# Patient Record
Sex: Male | Born: 1990 | Race: Black or African American | Hispanic: No | Marital: Single | Smoking: Current every day smoker
Health system: Southern US, Community
[De-identification: ages and names within clinical notes are randomized; demographics above are authoritative.]

---

## 2016-02-25 ENCOUNTER — Emergency Department (HOSPITAL_COMMUNITY): Payer: No Typology Code available for payment source

## 2016-02-25 ENCOUNTER — Encounter (HOSPITAL_COMMUNITY): Payer: Self-pay | Admitting: Anesthesiology

## 2016-02-25 ENCOUNTER — Inpatient Hospital Stay (HOSPITAL_COMMUNITY): Payer: No Typology Code available for payment source

## 2016-02-25 ENCOUNTER — Encounter (HOSPITAL_COMMUNITY): Payer: Self-pay

## 2016-02-25 ENCOUNTER — Inpatient Hospital Stay (HOSPITAL_COMMUNITY)
Admission: EM | Admit: 2016-02-25 | Discharge: 2016-02-27 | DRG: 090 | Disposition: A | Discharge status: Home / Self Care | Payer: No Typology Code available for payment source | Attending: General Surgery | Admitting: General Surgery

## 2016-02-25 DIAGNOSIS — L602 Onychogryphosis: Secondary | ICD-10-CM

## 2016-02-25 DIAGNOSIS — S060X9A Concussion with loss of consciousness of unspecified duration, initial encounter: Principal | ICD-10-CM | POA: Diagnosis present

## 2016-02-25 DIAGNOSIS — S01312A Laceration without foreign body of left ear, initial encounter: Secondary | ICD-10-CM | POA: Diagnosis present

## 2016-02-25 DIAGNOSIS — S51011A Laceration without foreign body of right elbow, initial encounter: Secondary | ICD-10-CM | POA: Diagnosis present

## 2016-02-25 DIAGNOSIS — M542 Cervicalgia: Secondary | ICD-10-CM | POA: Diagnosis present

## 2016-02-25 DIAGNOSIS — Y903 Blood alcohol level of 60-79 mg/100 ml: Secondary | ICD-10-CM | POA: Diagnosis present

## 2016-02-25 DIAGNOSIS — R402212 Coma scale, best verbal response, none, at arrival to emergency department: Secondary | ICD-10-CM | POA: Diagnosis present

## 2016-02-25 DIAGNOSIS — S060XAA Concussion with loss of consciousness status unknown, initial encounter: Secondary | ICD-10-CM | POA: Diagnosis present

## 2016-02-25 DIAGNOSIS — Z6835 Body mass index (BMI) 35.0-35.9, adult: Secondary | ICD-10-CM | POA: Diagnosis not present

## 2016-02-25 DIAGNOSIS — R402112 Coma scale, eyes open, never, at arrival to emergency department: Secondary | ICD-10-CM | POA: Diagnosis present

## 2016-02-25 DIAGNOSIS — R402312 Coma scale, best motor response, none, at arrival to emergency department: Secondary | ICD-10-CM | POA: Diagnosis present

## 2016-02-25 DIAGNOSIS — S161XXA Strain of muscle, fascia and tendon at neck level, initial encounter: Secondary | ICD-10-CM | POA: Diagnosis present

## 2016-02-25 DIAGNOSIS — F10129 Alcohol abuse with intoxication, unspecified: Secondary | ICD-10-CM | POA: Diagnosis present

## 2016-02-25 DIAGNOSIS — S0990XA Unspecified injury of head, initial encounter: Secondary | ICD-10-CM | POA: Diagnosis present

## 2016-02-25 DIAGNOSIS — E669 Obesity, unspecified: Secondary | ICD-10-CM | POA: Diagnosis present

## 2016-02-25 DIAGNOSIS — R000 Tachycardia, unspecified: Secondary | ICD-10-CM | POA: Diagnosis present

## 2016-02-25 LAB — I-STAT CHEM 8, ED
BUN: 12 mg/dL (ref 6–20)
CHLORIDE: 104 mmol/L (ref 101–111)
CREATININE: 1 mg/dL (ref 0.61–1.24)
Calcium, Ion: 1.05 mmol/L — ABNORMAL LOW (ref 1.13–1.30)
Glucose, Bld: 133 mg/dL — ABNORMAL HIGH (ref 65–99)
HEMATOCRIT: 48 % (ref 39.0–52.0)
Hemoglobin: 16.3 g/dL (ref 13.0–17.0)
Potassium: 3.2 mmol/L — ABNORMAL LOW (ref 3.5–5.1)
Sodium: 142 mmol/L (ref 135–145)
TCO2: 22 mmol/L (ref 0–100)

## 2016-02-25 LAB — URINALYSIS, ROUTINE W REFLEX MICROSCOPIC
Bilirubin Urine: NEGATIVE
Glucose, UA: NEGATIVE mg/dL
KETONES UR: NEGATIVE mg/dL
LEUKOCYTES UA: NEGATIVE
NITRITE: NEGATIVE
PROTEIN: NEGATIVE mg/dL
Specific Gravity, Urine: 1.029 (ref 1.005–1.030)
pH: 5.5 (ref 5.0–8.0)

## 2016-02-25 LAB — I-STAT ARTERIAL BLOOD GAS, ED
ACID-BASE DEFICIT: 1 mmol/L (ref 0.0–2.0)
BICARBONATE: 25.5 meq/L — AB (ref 20.0–24.0)
O2 SAT: 100 %
PCO2 ART: 49.3 mmHg — AB (ref 35.0–45.0)
PO2 ART: 451 mmHg — AB (ref 80.0–100.0)
TCO2: 27 mmol/L (ref 0–100)
pH, Arterial: 7.322 — ABNORMAL LOW (ref 7.350–7.450)

## 2016-02-25 LAB — PROTIME-INR
INR: 1.16 (ref 0.00–1.49)
PROTHROMBIN TIME: 15 s (ref 11.6–15.2)

## 2016-02-25 LAB — URINE MICROSCOPIC-ADD ON: WBC, UA: NONE SEEN WBC/hpf (ref 0–5)

## 2016-02-25 LAB — PREPARE FRESH FROZEN PLASMA
UNIT DIVISION: 0
Unit division: 0

## 2016-02-25 LAB — COMPREHENSIVE METABOLIC PANEL
ALBUMIN: 4.4 g/dL (ref 3.5–5.0)
ALK PHOS: 57 U/L (ref 38–126)
ALT: 50 U/L (ref 17–63)
AST: 46 U/L — AB (ref 15–41)
Anion gap: 12 (ref 5–15)
BILIRUBIN TOTAL: 0.8 mg/dL (ref 0.3–1.2)
BUN: 10 mg/dL (ref 6–20)
CALCIUM: 8.8 mg/dL — AB (ref 8.9–10.3)
CO2: 22 mmol/L (ref 22–32)
CREATININE: 1.07 mg/dL (ref 0.61–1.24)
Chloride: 105 mmol/L (ref 101–111)
GLUCOSE: 136 mg/dL — AB (ref 65–99)
POTASSIUM: 3.2 mmol/L — AB (ref 3.5–5.1)
Sodium: 139 mmol/L (ref 135–145)
TOTAL PROTEIN: 7.2 g/dL (ref 6.5–8.1)

## 2016-02-25 LAB — I-STAT CG4 LACTIC ACID, ED: Lactic Acid, Venous: 5.92 mmol/L (ref 0.5–1.9)

## 2016-02-25 LAB — TYPE AND SCREEN
ABO/RH(D): A POS
Antibody Screen: NEGATIVE
UNIT DIVISION: 0
UNIT DIVISION: 0

## 2016-02-25 LAB — BASIC METABOLIC PANEL
ANION GAP: 10 (ref 5–15)
BUN: 10 mg/dL (ref 6–20)
CALCIUM: 8.5 mg/dL — AB (ref 8.9–10.3)
CO2: 21 mmol/L — AB (ref 22–32)
CREATININE: 0.86 mg/dL (ref 0.61–1.24)
Chloride: 107 mmol/L (ref 101–111)
GFR calc non Af Amer: >60 mL/min (ref >60)
Glucose, Bld: 101 mg/dL — ABNORMAL HIGH (ref 65–99)
Potassium: 4 mmol/L (ref 3.5–5.1)
SODIUM: 138 mmol/L (ref 135–145)

## 2016-02-25 LAB — CBC
HCT: 43.1 % (ref 39.0–52.0)
HEMATOCRIT: 39.4 % (ref 39.0–52.0)
HEMOGLOBIN: 14.5 g/dL (ref 13.0–17.0)
HEMOGLOBIN: 13.3 g/dL (ref 13.0–17.0)
MCH: 28.4 pg (ref 26.0–34.0)
MCH: 28.5 pg (ref 26.0–34.0)
MCHC: 33.6 g/dL (ref 30.0–36.0)
MCHC: 33.8 g/dL (ref 30.0–36.0)
MCV: 84.5 fL (ref 78.0–100.0)
MCV: 84.5 fL (ref 78.0–100.0)
PLATELETS: 316 10*3/uL (ref 150–400)
Platelets: 274 10*3/uL (ref 150–400)
RBC: 5.1 MIL/uL (ref 4.22–5.81)
RBC: 4.66 MIL/uL (ref 4.22–5.81)
RDW: 14.1 % (ref 11.5–15.5)
RDW: 14.2 % (ref 11.5–15.5)
WBC: 14.3 10*3/uL — AB (ref 4.0–10.5)
WBC: 11.5 10*3/uL — AB (ref 4.0–10.5)

## 2016-02-25 LAB — ABO/RH: ABO/RH(D): A POS

## 2016-02-25 LAB — CDS SEROLOGY

## 2016-02-25 LAB — RAPID URINE DRUG SCREEN, HOSP PERFORMED
Amphetamines: NOT DETECTED
BENZODIAZEPINES: POSITIVE — AB
Barbiturates: NOT DETECTED
COCAINE: NOT DETECTED
OPIATES: NOT DETECTED
Tetrahydrocannabinol: POSITIVE — AB

## 2016-02-25 LAB — LACTIC ACID, PLASMA: LACTIC ACID, VENOUS: 3.4 mmol/L — AB (ref 0.5–1.9)

## 2016-02-25 LAB — ETHANOL: ALCOHOL ETHYL (B): 77 mg/dL — AB (ref <5)

## 2016-02-25 LAB — TRIGLYCERIDES: TRIGLYCERIDES: 161 mg/dL — AB (ref <150)

## 2016-02-25 LAB — MRSA PCR SCREENING: MRSA BY PCR: NEGATIVE

## 2016-02-25 MED ORDER — MIDAZOLAM HCL 2 MG/2ML IJ SOLN: 2.0000 mg | INTRAMUSCULAR | Status: DC | PRN

## 2016-02-25 MED ORDER — ETOMIDATE 2 MG/ML IV SOLN
INTRAVENOUS | Status: AC | PRN
  Administered 2016-02-25: 20 mg via INTRAVENOUS

## 2016-02-25 MED ORDER — PNEUMOCOCCAL VAC POLYVALENT 25 MCG/0.5ML IJ INJ
0.5000 mL | INJECTION | INTRAMUSCULAR | Status: DC
  Filled 2016-02-25: qty 0.5

## 2016-02-25 MED ORDER — MIDAZOLAM HCL 2 MG/2ML IJ SOLN
INTRAMUSCULAR | Status: AC
  Filled 2016-02-25: qty 4

## 2016-02-25 MED ORDER — STERILE WATER FOR INJECTION IJ SOLN
INTRAMUSCULAR | Status: AC
  Filled 2016-02-25: qty 20

## 2016-02-25 MED ORDER — SODIUM CHLORIDE 0.9 % IV SOLN
25.0000 ug/h | INTRAVENOUS | Status: DC
  Administered 2016-02-25: 50 ug/h via INTRAVENOUS
  Filled 2016-02-25 (×2): qty 50

## 2016-02-25 MED ORDER — ACETAMINOPHEN 325 MG PO TABS
650.0000 mg | ORAL_TABLET | ORAL | Status: DC | PRN
  Administered 2016-02-26: 650 mg via ORAL
  Filled 2016-02-25: qty 2

## 2016-02-25 MED ORDER — ENOXAPARIN SODIUM 30 MG/0.3ML ~~LOC~~ SOLN
30.0000 mg | Freq: Two times a day (BID) | SUBCUTANEOUS | Status: DC
  Administered 2016-02-25 – 2016-02-26 (×3): 30 mg via SUBCUTANEOUS
  Filled 2016-02-25 (×3): qty 0.3

## 2016-02-25 MED ORDER — PROPOFOL 1000 MG/100ML IV EMUL
INTRAVENOUS | Status: AC | PRN
  Administered 2016-02-25: 40.096 ug/kg/min via INTRAVENOUS

## 2016-02-25 MED ORDER — MIDAZOLAM HCL 2 MG/2ML IJ SOLN
INTRAMUSCULAR | Status: AC
  Filled 2016-02-25: qty 2

## 2016-02-25 MED ORDER — BISACODYL 10 MG RE SUPP: 10.0000 mg | Freq: Every day | RECTAL | Status: DC | PRN

## 2016-02-25 MED ORDER — FENTANYL CITRATE (PF) 100 MCG/2ML IJ SOLN
INTRAMUSCULAR | Status: AC
  Filled 2016-02-25: qty 4

## 2016-02-25 MED ORDER — IOPAMIDOL (ISOVUE-300) INJECTION 61%
INTRAVENOUS | Status: AC
  Administered 2016-02-25: 100 mL
  Filled 2016-02-25: qty 100

## 2016-02-25 MED ORDER — ONDANSETRON HCL 4 MG/2ML IJ SOLN
4.0000 mg | Freq: Four times a day (QID) | INTRAMUSCULAR | Status: DC | PRN
Start: 2016-02-25 — End: 2016-02-27

## 2016-02-25 MED ORDER — VECURONIUM BROMIDE 10 MG IV SOLR
INTRAVENOUS | Status: AC
  Filled 2016-02-25: qty 20

## 2016-02-25 MED ORDER — ONDANSETRON HCL 4 MG PO TABS: 4.0000 mg | ORAL_TABLET | Freq: Four times a day (QID) | ORAL | Status: DC | PRN

## 2016-02-25 MED ORDER — SUCCINYLCHOLINE CHLORIDE 20 MG/ML IJ SOLN
INTRAMUSCULAR | Status: AC | PRN
  Administered 2016-02-25: 100 mg via INTRAVENOUS

## 2016-02-25 MED ORDER — MIDAZOLAM HCL 5 MG/5ML IJ SOLN
INTRAMUSCULAR | Status: AC | PRN
  Administered 2016-02-25: 2 mg via INTRAVENOUS
  Administered 2016-02-25: 4 mg via INTRAVENOUS
  Administered 2016-02-25: 2 mg via INTRAVENOUS

## 2016-02-25 MED ORDER — FENTANYL CITRATE (PF) 100 MCG/2ML IJ SOLN: 50.0000 ug | Freq: Once | INTRAMUSCULAR | Status: AC

## 2016-02-25 MED ORDER — FENTANYL CITRATE (PF) 100 MCG/2ML IJ SOLN
INTRAMUSCULAR | Status: AC
  Filled 2016-02-25: qty 2

## 2016-02-25 MED ORDER — PROPOFOL 1000 MG/100ML IV EMUL
INTRAVENOUS | Status: AC
  Filled 2016-02-25: qty 100

## 2016-02-25 MED ORDER — ANTISEPTIC ORAL RINSE SOLUTION (CORINZ)
7.0000 mL | Freq: Four times a day (QID) | OROMUCOSAL | Status: DC
  Administered 2016-02-25 – 2016-02-26 (×6): 7 mL via OROMUCOSAL

## 2016-02-25 MED ORDER — VECURONIUM BROMIDE 10 MG IV SOLR
INTRAVENOUS | Status: AC | PRN
  Administered 2016-02-25: 10 mg via INTRAVENOUS

## 2016-02-25 MED ORDER — SODIUM CHLORIDE 0.9 % IV SOLN
INTRAVENOUS | Status: AC | PRN
  Administered 2016-02-25: 1000 mL via INTRAVENOUS

## 2016-02-25 MED ORDER — PROPOFOL 1000 MG/100ML IV EMUL
0.0000 ug/kg/min | INTRAVENOUS | Status: DC
  Administered 2016-02-25: 45 ug/kg/min via INTRAVENOUS
  Administered 2016-02-25: 50 ug/kg/min via INTRAVENOUS
  Administered 2016-02-25: 45 ug/kg/min via INTRAVENOUS
  Administered 2016-02-25 (×2): 50 ug/kg/min via INTRAVENOUS
  Administered 2016-02-25: 45 ug/kg/min via INTRAVENOUS
  Administered 2016-02-26 (×3): 50 ug/kg/min via INTRAVENOUS
  Filled 2016-02-25 (×9): qty 100

## 2016-02-25 MED ORDER — FENTANYL BOLUS VIA INFUSION
50.0000 ug | INTRAVENOUS | Status: DC | PRN
  Filled 2016-02-25: qty 50

## 2016-02-25 MED ORDER — SODIUM CHLORIDE 0.9 % IV SOLN
INTRAVENOUS | Status: DC
  Administered 2016-02-25 – 2016-02-26 (×3): via INTRAVENOUS

## 2016-02-25 MED ORDER — FENTANYL CITRATE (PF) 100 MCG/2ML IJ SOLN
INTRAMUSCULAR | Status: AC | PRN
  Administered 2016-02-25: 100 ug via INTRAVENOUS
  Administered 2016-02-25: 50 ug via INTRAVENOUS

## 2016-02-25 MED ORDER — CHLORHEXIDINE GLUCONATE 0.12% ORAL RINSE (MEDLINE KIT)
15.0000 mL | Freq: Two times a day (BID) | OROMUCOSAL | Status: DC
  Administered 2016-02-25 – 2016-02-26 (×3): 15 mL via OROMUCOSAL

## 2016-02-25 MED ORDER — HALOPERIDOL LACTATE 5 MG/ML IJ SOLN
5.0000 mg | Freq: Once | INTRAMUSCULAR | Status: AC
  Administered 2016-02-25: 5 mg via INTRAMUSCULAR
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
## 2016-02-25 NOTE — ED Notes (Signed)
Family updated as to patient's status. RN spoke [place]  [place]; Mother states it is ok for Jose NettersLeatrice Guzman to check on patient until she arrives;
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
## 2016-02-25 NOTE — Progress Notes (Signed)
CRITICAL VALUE ALERT  Critical value received:  3.4 Lactic Acid  Date of notification:  02/25/16  Time of notification:  0858  Critical value read back:Yes.    Nurse who received alert:  Serena ColonelYeni Pineda, RN  MD notified (1st page):    Time of first page:    MD notified (2nd page):  Time of second page:  Responding MD:    Time MD responded:
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
## 2016-02-26 ENCOUNTER — Encounter (HOSPITAL_COMMUNITY): Payer: Self-pay | Admitting: *Deleted

## 2016-02-26 ENCOUNTER — Inpatient Hospital Stay (HOSPITAL_COMMUNITY): Payer: No Typology Code available for payment source

## 2016-02-26 MED ORDER — LORAZEPAM 2 MG/ML IJ SOLN
1.0000 mg | Freq: Three times a day (TID) | INTRAMUSCULAR | Status: DC | PRN
  Administered 2016-02-26: 1 mg via INTRAVENOUS
  Filled 2016-02-26: qty 1

## 2016-02-26 MED ORDER — OXYCODONE HCL 5 MG PO TABS: 5.0000 mg | ORAL_TABLET | ORAL | Status: DC | PRN

## 2016-02-26 MED ORDER — MORPHINE SULFATE (PF) 2 MG/ML IV SOLN: 1.0000 mg | INTRAVENOUS | Status: DC | PRN
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
## 2016-02-26 NOTE — Procedures (Signed)
Extubation Procedure Note  Patient Details:   Name: Jose Guzman DOB: Dec 23, 1990 MRN: [id number]   Airway Documentation: Pt had an audible cuff leak prior to extubation, able to follow all commands.  Extubated to 2 lpm Dougherty sat 100%, HR 105, RR 19.  Strong, productive cough and able to state name and ask questions.  Will continue to monitor patient.    Evaluation  O2 sats: stable throughout Complications: No apparent complications Patient did tolerate procedure well. Bilateral Breath Sounds: Clear   Yes  Richmond CampbellHall, Dhriti Fales Lynn 02/26/2016, 8:58 AM

## 2016-02-27 ENCOUNTER — Inpatient Hospital Stay (HOSPITAL_COMMUNITY): Payer: No Typology Code available for payment source

## 2016-02-27 DIAGNOSIS — S060X9A Concussion with loss of consciousness of unspecified duration, initial encounter: Secondary | ICD-10-CM | POA: Diagnosis present

## 2016-02-27 DIAGNOSIS — S161XXA Strain of muscle, fascia and tendon at neck level, initial encounter: Secondary | ICD-10-CM | POA: Diagnosis present

## 2016-02-27 DIAGNOSIS — S060XAA Concussion with loss of consciousness status unknown, initial encounter: Secondary | ICD-10-CM | POA: Diagnosis present

## 2016-02-27 LAB — CBC
HEMATOCRIT: 40.2 % (ref 39.0–52.0)
Hemoglobin: 13.3 g/dL (ref 13.0–17.0)
MCH: 27.8 pg (ref 26.0–34.0)
MCHC: 33.1 g/dL (ref 30.0–36.0)
MCV: 84.1 fL (ref 78.0–100.0)
Platelets: 276 10*3/uL (ref 150–400)
RBC: 4.78 MIL/uL (ref 4.22–5.81)
RDW: 13.7 % (ref 11.5–15.5)
WBC: 13.7 10*3/uL — AB (ref 4.0–10.5)

## 2016-02-27 LAB — BASIC METABOLIC PANEL
Anion gap: 7 (ref 5–15)
BUN: <5 mg/dL — ABNORMAL LOW (ref 6–20)
CHLORIDE: 104 mmol/L (ref 101–111)
CO2: 25 mmol/L (ref 22–32)
Calcium: 9 mg/dL (ref 8.9–10.3)
Creatinine, Ser: 0.91 mg/dL (ref 0.61–1.24)
GFR calc non Af Amer: >60 mL/min (ref >60)
Glucose, Bld: 107 mg/dL — ABNORMAL HIGH (ref 65–99)
POTASSIUM: 3.5 mmol/L (ref 3.5–5.1)
SODIUM: 136 mmol/L (ref 135–145)

## 2016-02-27 MED ORDER — NAPROXEN 500 MG PO TABS: 500.0000 mg | ORAL_TABLET | Freq: Two times a day (BID) | ORAL | Status: AC

## 2016-02-27 MED ORDER — SODIUM CHLORIDE 0.9% FLUSH: 3.0000 mL | INTRAVENOUS | Status: DC | PRN

## 2016-02-27 MED ORDER — TRAMADOL HCL 50 MG PO TABS
100.0000 mg | ORAL_TABLET | Freq: Four times a day (QID) | ORAL | Status: DC | PRN
  Administered 2016-02-27: 100 mg via ORAL
  Filled 2016-02-27: qty 2

## 2016-02-27 NOTE — Progress Notes (Signed)
Both PIVs d/c'd, soft neck collar on.  AVS with discharge instructions & medications reviewed with patient and his mother.  Discharged to home ambulatory.
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
## 2016-02-27 NOTE — Progress Notes (Signed)
Orthopedic Tech Progress Note Patient Details:  Jose Guzman [birth year] [id number]  Ortho Devices Type of Ortho Device: Soft collar Ortho Device/Splint Interventions: Application   Saul FordyceJennifer C Xadrian Craighead 02/27/2016, 11:07 AM
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
## 2016-02-27 NOTE — Progress Notes (Signed)
   02/27/16 0930  Clinical Encounter Type  Visited With Patient  Visit Type Initial  Spiritual Encounters  Spiritual Needs Prayer  Chaplain on morning rounds visited with patient.  Chaplain prayed for patient and offered to follow up.
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
## 2017-08-08 IMAGING — CR DG ABD PORTABLE 1V
1 series · 1 of 1 positions shown · non-contrast
Comparison: None.

CLINICAL DATA: Orogastric tube placement.  Initial encounter.

EXAM:
PORTABLE ABDOMEN - 1 VIEW

[AP]
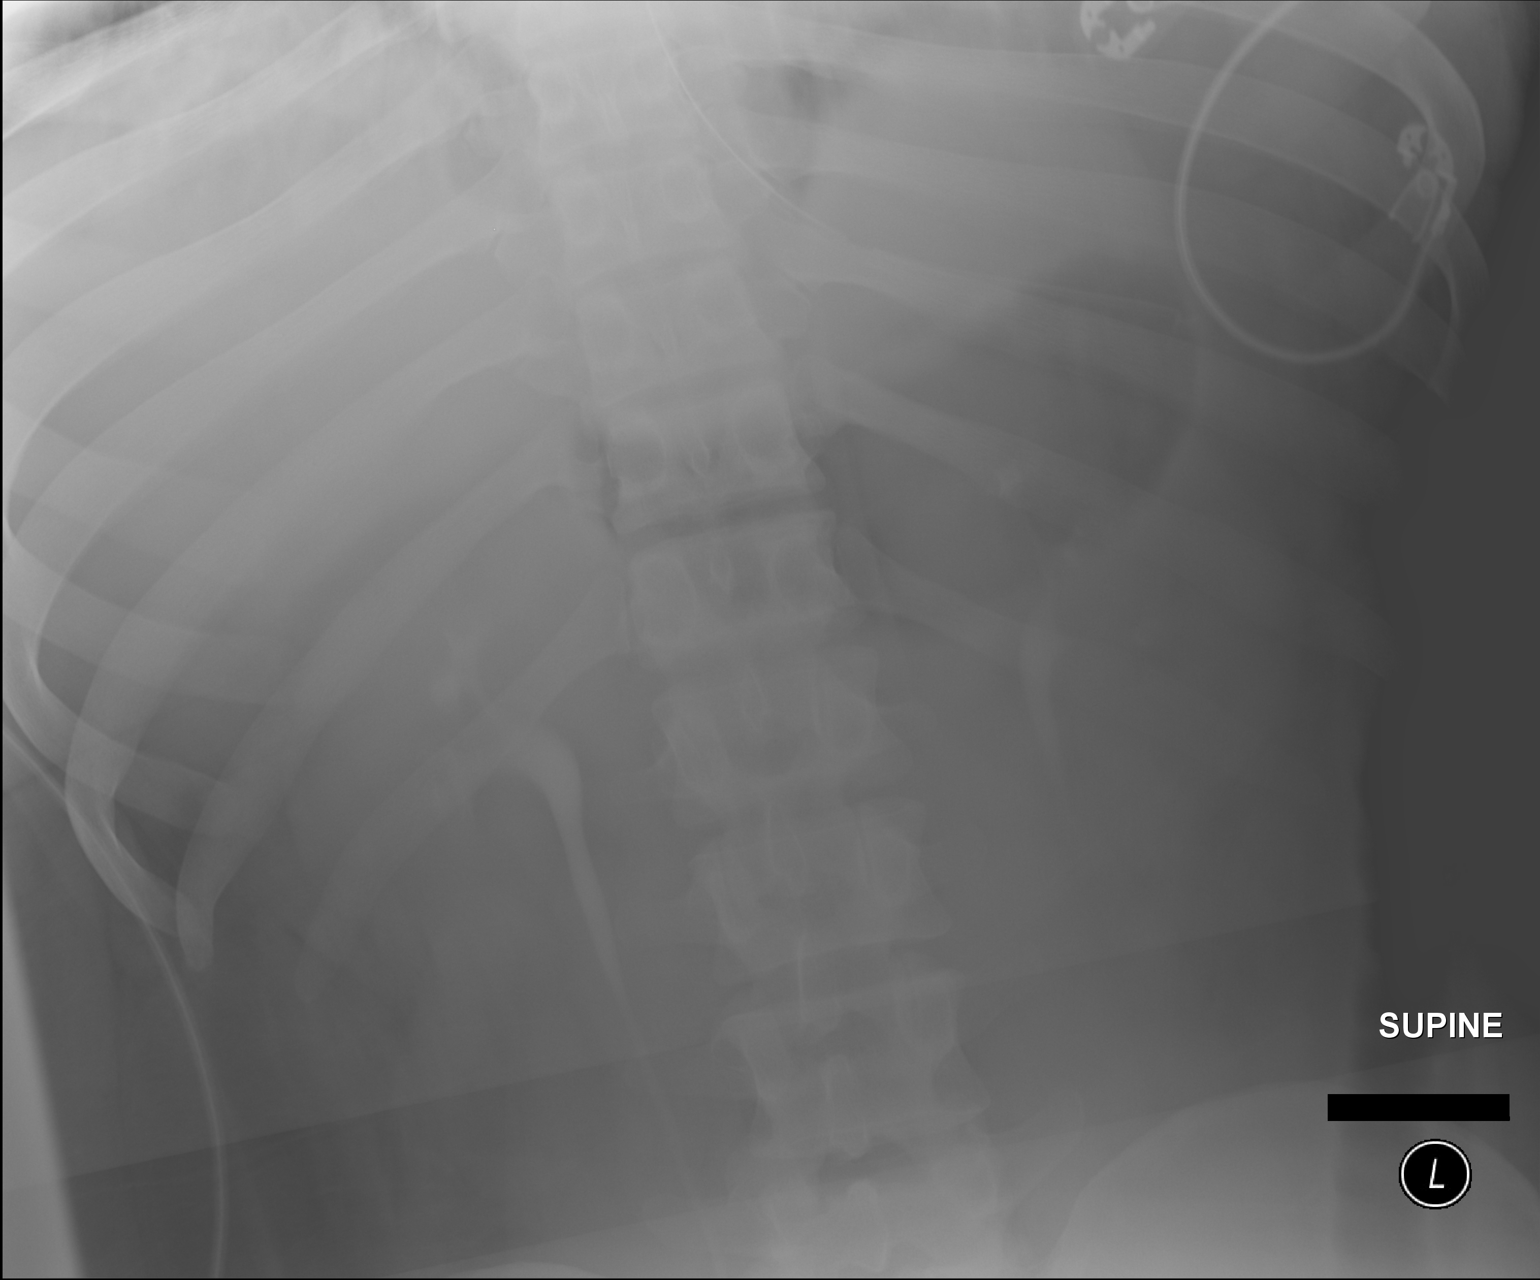

[1 of 1 positions shown; findings below may reference images not displayed]

FINDINGS: The patient's enteric tube is seen ending overlying the body of the
stomach, with the side port about the gastroesophageal junction.

Contrast is noted within the renal calyces and ureters bilaterally.
The bowel gas pattern is not well assessed due to motion artifact.
No acute osseous abnormalities are seen.
IMPRESSION: Enteric tube noted ending overlying the body of the stomach, with
the side port about the gastroesophageal junction.
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
